# Patient Record
Sex: Female | Born: 1970 | Race: White | Hispanic: No | Marital: Single
Health system: Southern US, Community
[De-identification: ages and names within clinical notes are randomized; demographics above are authoritative.]

---

## 2005-10-28 ENCOUNTER — Emergency Department: Payer: Self-pay | Admitting: Emergency Medicine

## 2005-12-16 ENCOUNTER — Encounter: Admission: RE | Admit: 2005-12-16 | Discharge: 2006-03-16 | Payer: Self-pay | Admitting: Specialist

## 2005-12-23 ENCOUNTER — Ambulatory Visit: Payer: Self-pay | Admitting: Psychiatry

## 2005-12-23 ENCOUNTER — Other Ambulatory Visit (HOSPITAL_COMMUNITY): Admission: RE | Admit: 2005-12-23 | Discharge: 2006-03-23 | Payer: Self-pay | Admitting: Psychiatry

## 2006-02-18 ENCOUNTER — Emergency Department: Payer: Self-pay | Admitting: Emergency Medicine

## 2006-02-20 ENCOUNTER — Ambulatory Visit: Payer: Self-pay

## 2006-02-21 ENCOUNTER — Emergency Department: Payer: Self-pay | Admitting: Emergency Medicine

## 2006-03-06 ENCOUNTER — Ambulatory Visit: Payer: Self-pay

## 2007-04-07 ENCOUNTER — Emergency Department: Payer: Self-pay | Admitting: Emergency Medicine

## 2007-04-18 ENCOUNTER — Emergency Department: Payer: Self-pay | Admitting: Emergency Medicine

## 2007-05-16 ENCOUNTER — Inpatient Hospital Stay: Payer: Self-pay | Admitting: Unknown Physician Specialty

## 2007-05-16 ENCOUNTER — Other Ambulatory Visit: Payer: Self-pay

## 2007-08-19 ENCOUNTER — Emergency Department: Payer: Self-pay | Admitting: Emergency Medicine

## 2007-12-06 ENCOUNTER — Emergency Department: Payer: Self-pay | Admitting: Emergency Medicine

## 2007-12-22 ENCOUNTER — Ambulatory Visit: Payer: Self-pay

## 2007-12-23 ENCOUNTER — Ambulatory Visit: Payer: Self-pay

## 2008-12-25 ENCOUNTER — Ambulatory Visit: Payer: Self-pay | Admitting: Family Medicine

## 2009-01-09 ENCOUNTER — Ambulatory Visit: Payer: Self-pay | Admitting: Surgery

## 2009-01-12 ENCOUNTER — Ambulatory Visit: Payer: Self-pay | Admitting: Surgery

## 2009-03-07 ENCOUNTER — Emergency Department: Payer: Self-pay | Admitting: Emergency Medicine

## 2009-03-14 ENCOUNTER — Ambulatory Visit: Payer: Self-pay | Admitting: Gastroenterology

## 2009-04-02 ENCOUNTER — Ambulatory Visit: Payer: Self-pay | Admitting: Specialist

## 2009-04-05 ENCOUNTER — Ambulatory Visit: Payer: Self-pay | Admitting: Specialist

## 2009-05-01 ENCOUNTER — Ambulatory Visit: Payer: Self-pay | Admitting: Surgery

## 2009-05-17 ENCOUNTER — Emergency Department: Payer: Self-pay | Admitting: Unknown Physician Specialty

## 2009-05-20 ENCOUNTER — Emergency Department: Payer: Self-pay | Admitting: Emergency Medicine

## 2009-07-13 ENCOUNTER — Ambulatory Visit: Payer: Self-pay | Admitting: Surgery

## 2009-07-21 ENCOUNTER — Inpatient Hospital Stay: Payer: Self-pay | Admitting: Surgery

## 2009-08-07 ENCOUNTER — Ambulatory Visit: Payer: Self-pay | Admitting: Surgery

## 2009-09-20 ENCOUNTER — Emergency Department: Payer: Self-pay | Admitting: Emergency Medicine

## 2009-09-21 ENCOUNTER — Emergency Department: Payer: Self-pay | Admitting: Internal Medicine

## 2010-03-03 ENCOUNTER — Emergency Department: Payer: Self-pay | Admitting: Emergency Medicine

## 2010-04-01 ENCOUNTER — Ambulatory Visit: Payer: Self-pay | Admitting: Specialist

## 2010-04-10 ENCOUNTER — Ambulatory Visit: Payer: Self-pay | Admitting: Specialist

## 2010-08-29 ENCOUNTER — Emergency Department: Payer: Self-pay | Admitting: Emergency Medicine

## 2010-11-21 ENCOUNTER — Emergency Department: Payer: Self-pay | Admitting: Emergency Medicine

## 2010-11-22 ENCOUNTER — Emergency Department: Payer: Self-pay | Admitting: *Deleted

## 2010-11-24 ENCOUNTER — Emergency Department: Payer: Self-pay | Admitting: Emergency Medicine

## 2010-11-27 ENCOUNTER — Emergency Department: Payer: Self-pay | Admitting: Emergency Medicine

## 2010-12-19 ENCOUNTER — Emergency Department: Payer: Self-pay | Admitting: Unknown Physician Specialty

## 2010-12-25 ENCOUNTER — Emergency Department: Payer: Self-pay | Admitting: Emergency Medicine

## 2010-12-29 ENCOUNTER — Emergency Department: Payer: Self-pay | Admitting: Emergency Medicine

## 2011-01-28 ENCOUNTER — Emergency Department: Payer: Self-pay | Admitting: Emergency Medicine

## 2011-02-03 ENCOUNTER — Emergency Department: Payer: Self-pay | Admitting: Emergency Medicine

## 2011-02-06 ENCOUNTER — Ambulatory Visit: Payer: Self-pay | Admitting: Gastroenterology

## 2011-02-14 ENCOUNTER — Emergency Department: Payer: Self-pay | Admitting: Emergency Medicine

## 2011-02-28 ENCOUNTER — Ambulatory Visit: Payer: Self-pay | Admitting: Specialist

## 2011-03-27 ENCOUNTER — Emergency Department: Payer: Self-pay | Admitting: Emergency Medicine

## 2011-04-04 ENCOUNTER — Emergency Department: Payer: Self-pay | Admitting: Emergency Medicine

## 2011-04-08 ENCOUNTER — Emergency Department: Payer: Self-pay | Admitting: Emergency Medicine

## 2011-04-11 ENCOUNTER — Encounter: Payer: Self-pay | Admitting: Cardiothoracic Surgery

## 2011-04-11 ENCOUNTER — Encounter: Payer: Self-pay | Admitting: Nurse Practitioner

## 2011-04-25 ENCOUNTER — Emergency Department: Payer: Self-pay

## 2011-05-08 ENCOUNTER — Emergency Department: Payer: Self-pay | Admitting: Emergency Medicine

## 2011-05-12 ENCOUNTER — Emergency Department: Payer: Self-pay | Admitting: *Deleted

## 2011-05-15 ENCOUNTER — Inpatient Hospital Stay: Payer: Self-pay | Admitting: Psychiatry

## 2011-05-20 ENCOUNTER — Ambulatory Visit: Payer: Self-pay | Admitting: Unknown Physician Specialty

## 2011-05-31 ENCOUNTER — Ambulatory Visit: Payer: Self-pay | Admitting: Unknown Physician Specialty

## 2011-06-04 ENCOUNTER — Emergency Department: Payer: Self-pay

## 2011-06-05 ENCOUNTER — Emergency Department: Payer: Self-pay | Admitting: *Deleted

## 2011-06-08 ENCOUNTER — Emergency Department: Payer: Self-pay | Admitting: Internal Medicine

## 2011-07-08 ENCOUNTER — Ambulatory Visit: Payer: Self-pay | Admitting: Family Medicine

## 2011-07-10 ENCOUNTER — Ambulatory Visit: Payer: Self-pay | Admitting: Family Medicine

## 2011-07-10 ENCOUNTER — Ambulatory Visit: Payer: Self-pay | Admitting: Specialist

## 2011-07-10 DIAGNOSIS — Z0289 Encounter for other administrative examinations: Secondary | ICD-10-CM

## 2011-07-19 ENCOUNTER — Emergency Department: Payer: Self-pay | Admitting: Emergency Medicine

## 2011-08-05 ENCOUNTER — Ambulatory Visit: Payer: Self-pay | Admitting: Specialist

## 2011-08-11 ENCOUNTER — Emergency Department: Payer: Self-pay | Admitting: Emergency Medicine

## 2011-09-03 ENCOUNTER — Other Ambulatory Visit: Payer: Self-pay | Admitting: Pain Medicine

## 2011-09-03 ENCOUNTER — Ambulatory Visit: Payer: Self-pay | Admitting: Pain Medicine

## 2011-09-03 LAB — MAGNESIUM: Magnesium: 1.9 mg/dL

## 2011-09-03 LAB — COMPREHENSIVE METABOLIC PANEL
Albumin: 3.6 g/dL (ref 3.4–5.0)
Anion Gap: 12 (ref 7–16)
Bilirubin,Total: 0.2 mg/dL (ref 0.2–1.0)
Calcium, Total: 8.8 mg/dL (ref 8.5–10.1)
Co2: 21 mmol/L (ref 21–32)
Creatinine: 0.72 mg/dL (ref 0.60–1.30)
EGFR (Non-African Amer.): >60
Osmolality: 277 (ref 275–301)
SGOT(AST): 16 U/L (ref 15–37)
Total Protein: 8.1 g/dL (ref 6.4–8.2)

## 2011-09-03 LAB — SEDIMENTATION RATE: Erythrocyte Sed Rate: 22 mm/hr — ABNORMAL HIGH (ref 0–20)

## 2011-09-03 LAB — FOLATE: Folic Acid: 3.9 ng/mL (ref 3.1–100.0)

## 2011-09-05 ENCOUNTER — Ambulatory Visit: Payer: Self-pay | Admitting: Pain Medicine

## 2011-09-15 ENCOUNTER — Emergency Department: Payer: Self-pay | Admitting: Emergency Medicine

## 2011-09-15 LAB — URINALYSIS, COMPLETE
Bilirubin,UR: NEGATIVE
Glucose,UR: NEGATIVE mg/dL (ref 0–75)
Nitrite: NEGATIVE
RBC,UR: 9 /HPF (ref 0–5)
Squamous Epithelial: 41
WBC UR: 18 /HPF (ref 0–5)

## 2011-09-15 LAB — CBC
HGB: 10.9 g/dL — ABNORMAL LOW (ref 12.0–16.0)
RBC: 4.45 10*6/uL (ref 3.80–5.20)
RDW: 17.8 % — ABNORMAL HIGH (ref 11.5–14.5)

## 2011-09-15 LAB — COMPREHENSIVE METABOLIC PANEL
Albumin: 3.3 g/dL — ABNORMAL LOW (ref 3.4–5.0)
Alkaline Phosphatase: 88 U/L (ref 50–136)
Anion Gap: 13 (ref 7–16)
BUN: 6 mg/dL — ABNORMAL LOW (ref 7–18)
Bilirubin,Total: 0.2 mg/dL (ref 0.2–1.0)
Calcium, Total: 8.5 mg/dL (ref 8.5–10.1)
Creatinine: 0.8 mg/dL (ref 0.60–1.30)
Osmolality: 281 (ref 275–301)
Potassium: 3.7 mmol/L (ref 3.5–5.1)
Sodium: 142 mmol/L (ref 136–145)
Total Protein: 7.5 g/dL (ref 6.4–8.2)

## 2011-09-15 LAB — PREGNANCY, URINE: Pregnancy Test, Urine: NEGATIVE m[IU]/mL

## 2011-09-15 LAB — WET PREP, GENITAL

## 2011-09-16 ENCOUNTER — Encounter: Payer: Self-pay | Admitting: Family Medicine

## 2011-09-17 ENCOUNTER — Emergency Department: Payer: Self-pay | Admitting: Emergency Medicine

## 2011-10-08 ENCOUNTER — Ambulatory Visit: Payer: Self-pay | Admitting: Pain Medicine

## 2011-10-13 IMAGING — US ABDOMEN ULTRASOUND
1 series · 17 of 25 positions shown · non-contrast
Comparison: none

REASON FOR EXAM: RUQ pain
COMMENTS:

[Series 1: abdomen ultrasound · 17 of 47 slices shown]
[im 1/47]
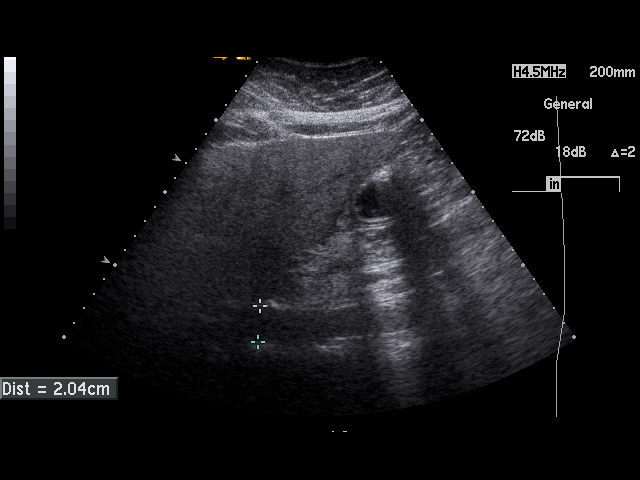
[im 4/47]
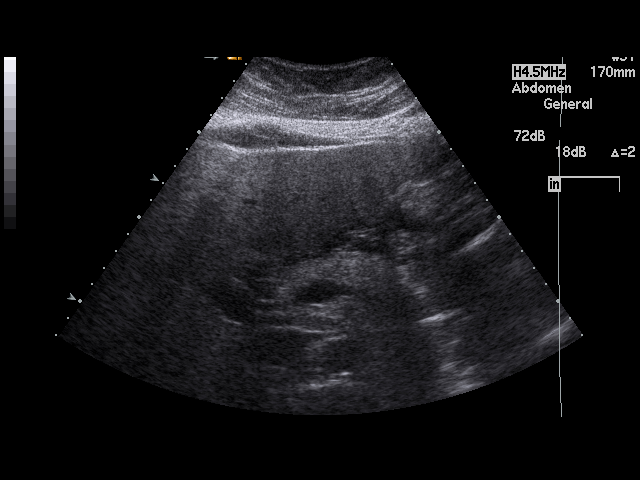
[im 6/47]
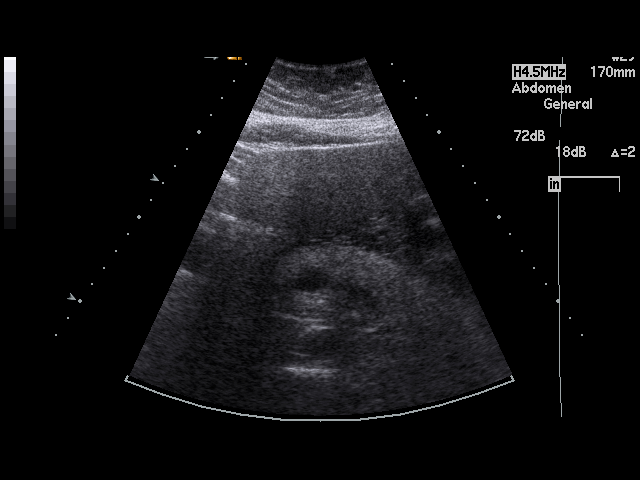
[im 10/47]
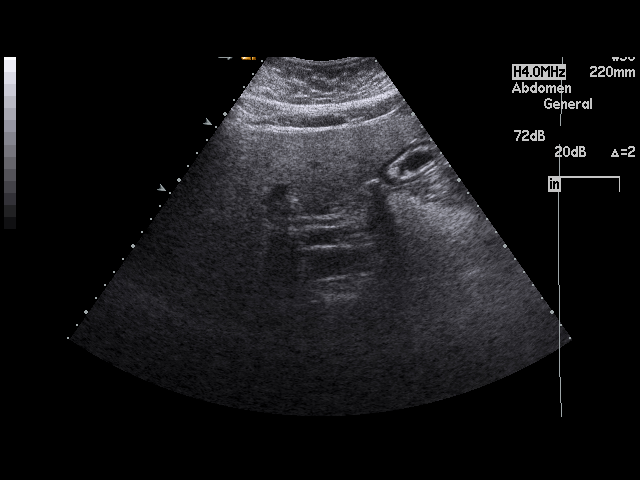
[im 12/47]
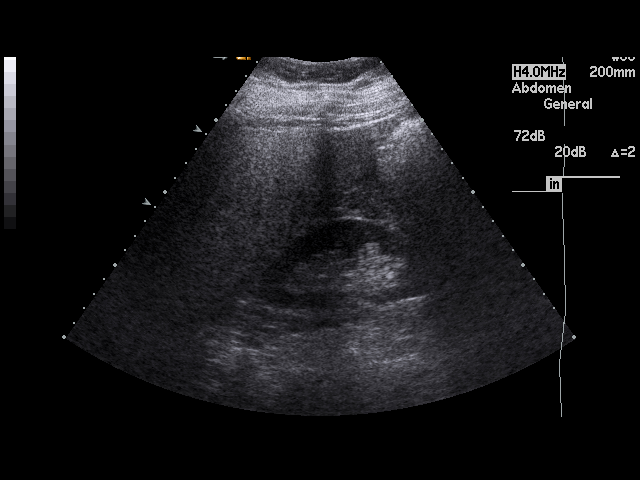
[im 16/47]
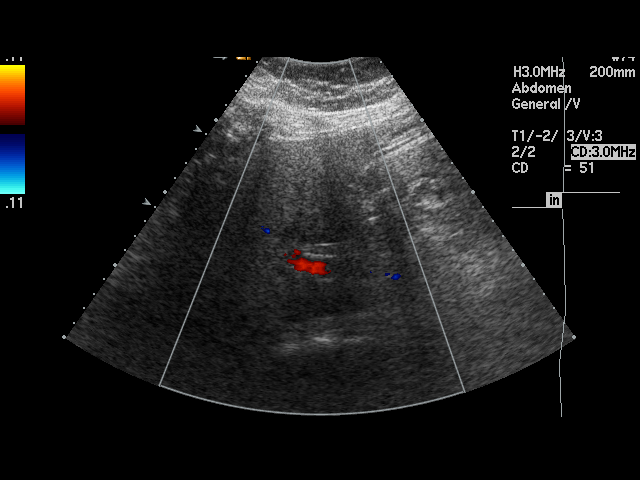
[im 18/47]
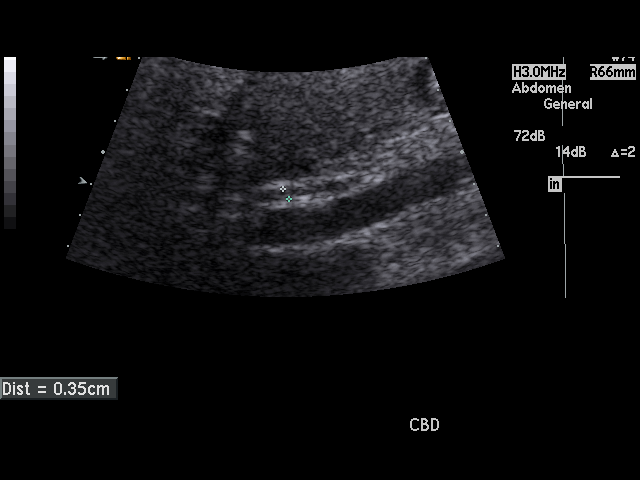
[im 22/47]
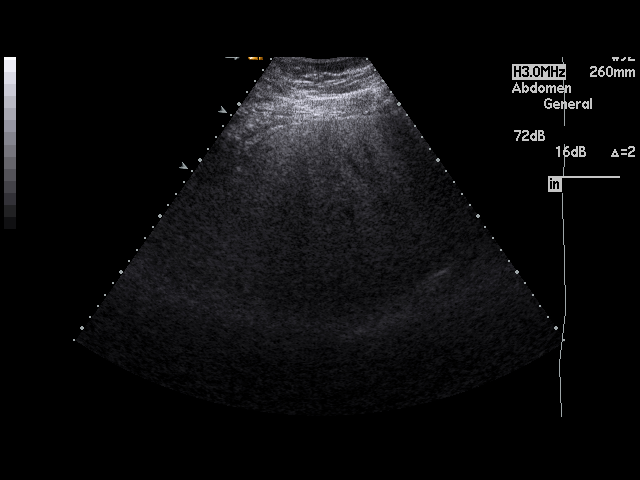
[im 24/47]
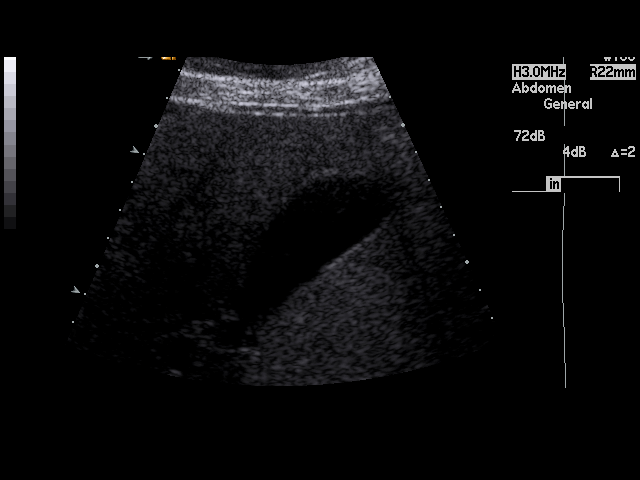
[im 25/47]
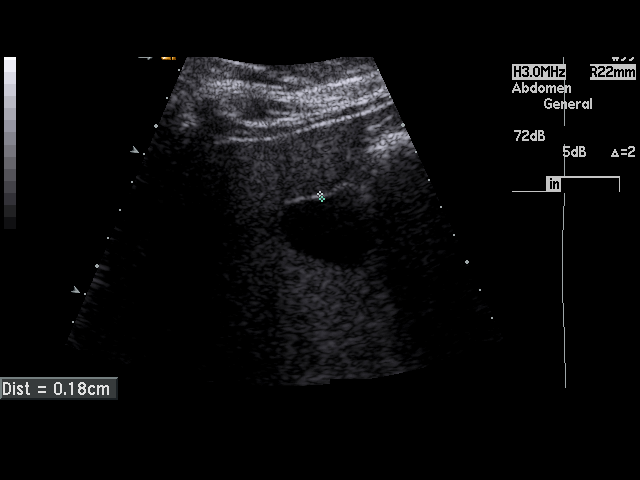
[im 29/47]
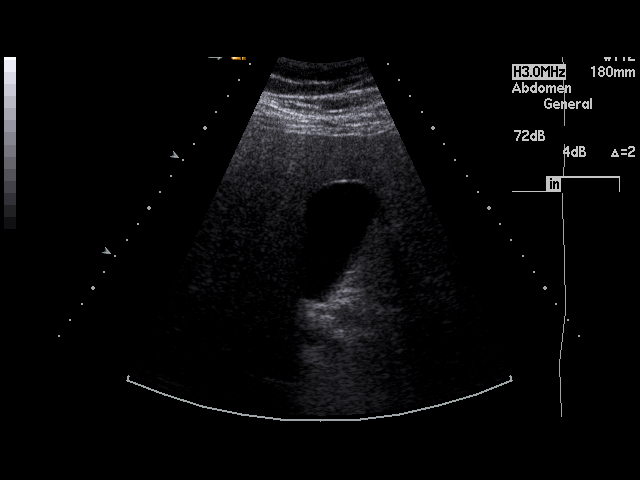
[im 31/47]
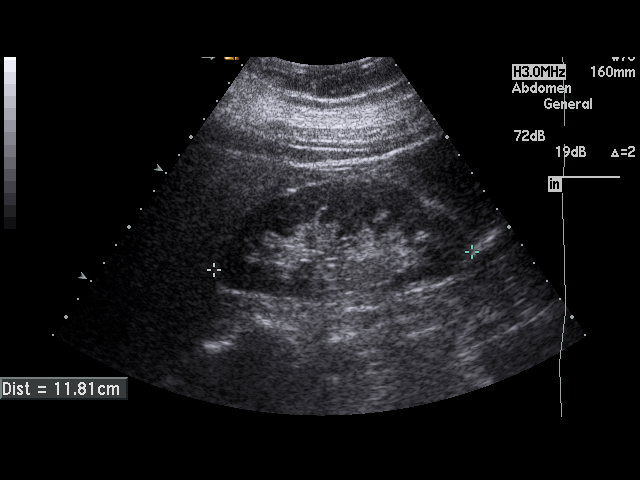
[im 35/47]
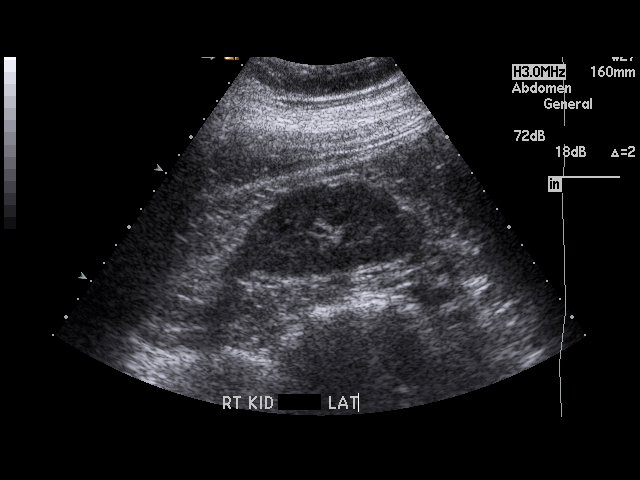
[im 37/47]
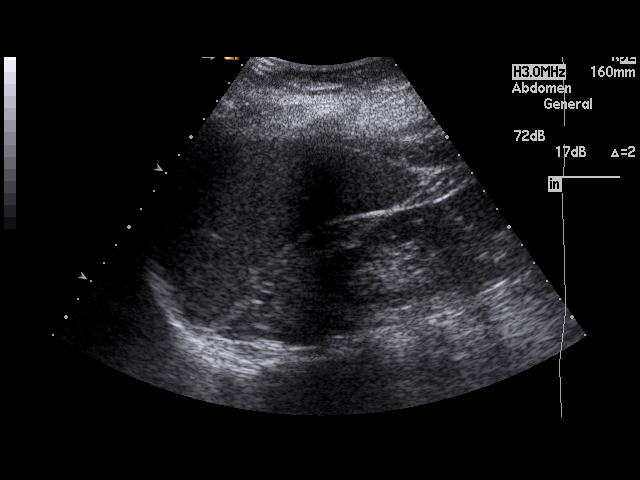
[im 41/47]
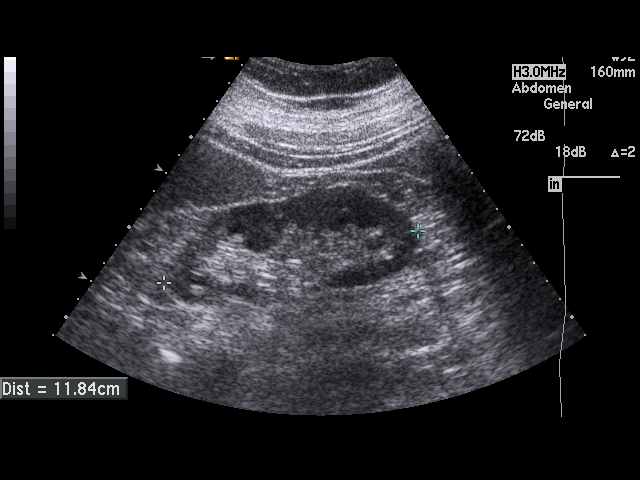
[im 43/47]
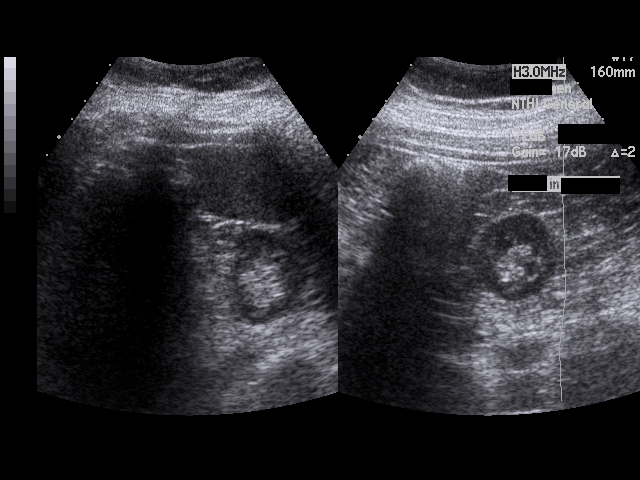
[im 47/47]
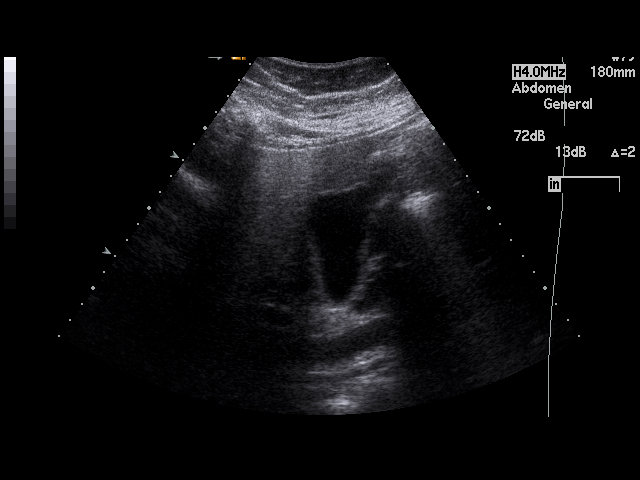

[17 of 25 positions shown; findings below may reference images not displayed]

PROCEDURE:     US  - US ABDOMEN GENERAL SURVEY  - August 07, 2009  [DATE]

RESULT:     The liver appears hyperechogenic, suspicious for fatty
infiltration. No focal tight mass lesions are seen. A portion of the
pancreatic tail is obscured but otherwise the pancreas is normal in
appearance. The abdominal aorta and inferior vena cava show no significant
abnormalities. Spleen size is normal. No gallstones are seen. There is no
thickening of the gallbladder wall. The common bile duct measures 3.5 mm in
diameter which is within normal limits. The kidneys show no hydronephrosis.
There is no ascites.
IMPRESSION: 1. Probable fatty infiltration of the liver.
2. No gallstones or other acute change is identified

## 2011-10-16 ENCOUNTER — Ambulatory Visit: Payer: Self-pay | Admitting: Pain Medicine

## 2011-11-01 ENCOUNTER — Emergency Department: Payer: Self-pay | Admitting: Unknown Physician Specialty

## 2011-11-26 ENCOUNTER — Ambulatory Visit: Payer: Self-pay | Admitting: Obstetrics and Gynecology

## 2011-11-26 LAB — URINALYSIS, COMPLETE
Bilirubin,UR: NEGATIVE
Blood: NEGATIVE
Glucose,UR: NEGATIVE mg/dL (ref 0–75)
Protein: 30
RBC,UR: 3 /HPF (ref 0–5)
Specific Gravity: 1.028 (ref 1.003–1.030)
Squamous Epithelial: 8

## 2011-11-26 LAB — PREGNANCY, URINE: Pregnancy Test, Urine: NEGATIVE m[IU]/mL

## 2011-11-26 LAB — WBC: WBC: 10 10*3/uL (ref 3.6–11.0)

## 2011-12-11 ENCOUNTER — Ambulatory Visit: Payer: Self-pay | Admitting: Obstetrics and Gynecology

## 2011-12-11 LAB — PREGNANCY, URINE: Pregnancy Test, Urine: NEGATIVE m[IU]/mL

## 2011-12-12 LAB — PATHOLOGY REPORT

## 2011-12-12 LAB — HEMATOCRIT: HCT: 30.9 % — ABNORMAL LOW (ref 35.0–47.0)

## 2012-03-24 ENCOUNTER — Emergency Department: Payer: Self-pay | Admitting: Emergency Medicine

## 2012-03-24 LAB — URINALYSIS, COMPLETE
Bilirubin,UR: NEGATIVE
Blood: NEGATIVE
Glucose,UR: NEGATIVE mg/dL (ref 0–75)
Leukocyte Esterase: NEGATIVE
Protein: NEGATIVE
Specific Gravity: 1.015 (ref 1.003–1.030)
Squamous Epithelial: 3

## 2012-03-25 LAB — CBC
HCT: 42.5 % (ref 35.0–47.0)
HGB: 13.8 g/dL (ref 12.0–16.0)
MCH: 26.8 pg (ref 26.0–34.0)
MCHC: 32.5 g/dL (ref 32.0–36.0)
WBC: 13.9 10*3/uL — ABNORMAL HIGH (ref 3.6–11.0)

## 2012-03-25 LAB — COMPREHENSIVE METABOLIC PANEL
Albumin: 3.5 g/dL (ref 3.4–5.0)
Alkaline Phosphatase: 103 U/L (ref 50–136)
Anion Gap: 12 (ref 7–16)
BUN: 8 mg/dL (ref 7–18)
Bilirubin,Total: 0.4 mg/dL (ref 0.2–1.0)
Chloride: 109 mmol/L — ABNORMAL HIGH (ref 98–107)
Creatinine: 0.64 mg/dL (ref 0.60–1.30)
Glucose: 81 mg/dL (ref 65–99)
SGOT(AST): 12 U/L — ABNORMAL LOW (ref 15–37)
SGPT (ALT): 15 U/L (ref 12–78)
Total Protein: 7.5 g/dL (ref 6.4–8.2)

## 2012-04-14 ENCOUNTER — Emergency Department: Payer: Self-pay | Admitting: Emergency Medicine

## 2012-04-16 ENCOUNTER — Emergency Department: Payer: Self-pay | Admitting: Emergency Medicine

## 2012-04-23 ENCOUNTER — Encounter: Payer: Self-pay | Admitting: Nurse Practitioner

## 2012-04-23 ENCOUNTER — Encounter: Payer: Self-pay | Admitting: Cardiothoracic Surgery

## 2012-06-27 ENCOUNTER — Inpatient Hospital Stay: Payer: Self-pay | Admitting: Psychiatry

## 2012-06-27 LAB — COMPREHENSIVE METABOLIC PANEL
Alkaline Phosphatase: 99 U/L (ref 50–136)
Anion Gap: 7 (ref 7–16)
Calcium, Total: 8.6 mg/dL (ref 8.5–10.1)
Co2: 23 mmol/L (ref 21–32)
Creatinine: 0.73 mg/dL (ref 0.60–1.30)
EGFR (African American): >60
EGFR (Non-African Amer.): >60
Potassium: 3.9 mmol/L (ref 3.5–5.1)
SGOT(AST): 8 U/L — ABNORMAL LOW (ref 15–37)
SGPT (ALT): 12 U/L (ref 12–78)
Total Protein: 7.9 g/dL (ref 6.4–8.2)

## 2012-06-27 LAB — CBC
HGB: 14.4 g/dL (ref 12.0–16.0)
MCHC: 34.3 g/dL (ref 32.0–36.0)
Platelet: 296 10*3/uL (ref 150–440)
RDW: 17.9 % — ABNORMAL HIGH (ref 11.5–14.5)
WBC: 10.7 10*3/uL (ref 3.6–11.0)

## 2012-06-27 LAB — ETHANOL: Ethanol: <3 mg/dL

## 2012-06-28 LAB — URINALYSIS, COMPLETE
Bilirubin,UR: NEGATIVE
Blood: NEGATIVE
Glucose,UR: NEGATIVE mg/dL (ref 0–75)
Ketone: NEGATIVE
Nitrite: NEGATIVE
Ph: 5 (ref 4.5–8.0)
Protein: 30
RBC,UR: 6 /HPF (ref 0–5)
Specific Gravity: 1.023 (ref 1.003–1.030)
Squamous Epithelial: 9

## 2012-06-28 LAB — DRUG SCREEN, URINE
Amphetamines, Ur Screen: NEGATIVE (ref ?–1000)
Barbiturates, Ur Screen: NEGATIVE (ref ?–200)
Benzodiazepine, Ur Scrn: POSITIVE (ref ?–200)
MDMA (Ecstasy)Ur Screen: NEGATIVE (ref ?–500)
Methadone, Ur Screen: NEGATIVE (ref ?–300)
Phencyclidine (PCP) Ur S: NEGATIVE (ref ?–25)

## 2012-08-07 ENCOUNTER — Emergency Department: Payer: Self-pay | Admitting: Emergency Medicine

## 2012-09-28 DEATH — deceased

## 2013-11-20 IMAGING — CT CT STONE STUDY
1 of 2 series · 15 of 32 positions shown, 19 images · non-contrast
Comparison: None

REASON FOR EXAM: L flank pain
COMMENTS:

PROCEDURE:     CT  - CT ABDOMEN /PELVIS WO (STONE)  - September 15, 2011  [DATE]
RESULT:     Indication: Left flank pain
TECHNIQUE: Multiple axial images from the lung bases to the symphysis pubis
were obtained without oral and without intravenous contrast.

[Series 2: stone · axial · 0.74mm/px · z∈[-608,-167]mm · 15 of 167 slices shown, 19 images]
[im 13/167  soft-tissue]
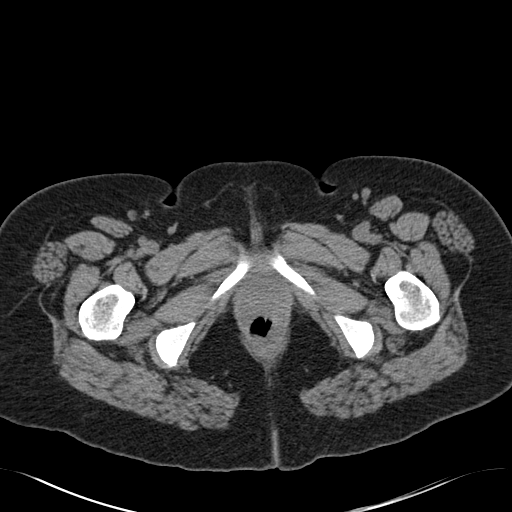
[im 13/167  bone]
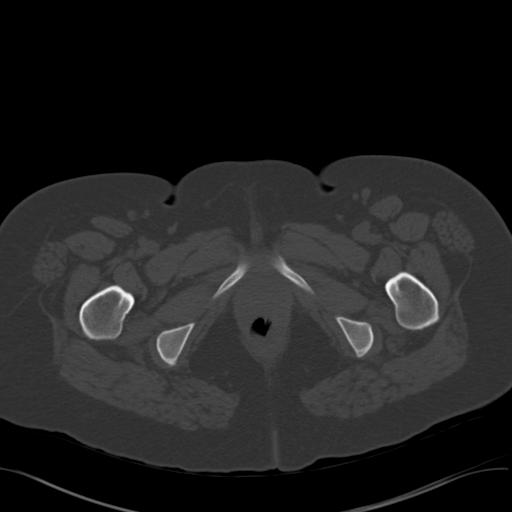
[im 25/167  soft-tissue]
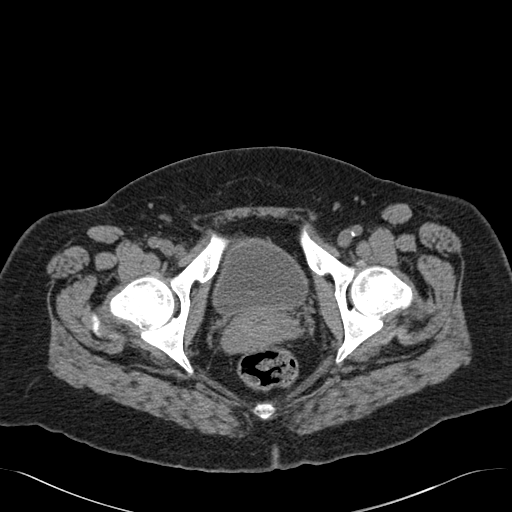
[im 37/167  soft-tissue]
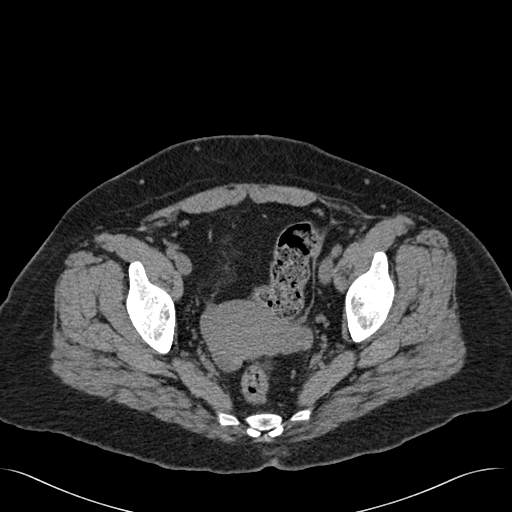
[im 50/167  soft-tissue]
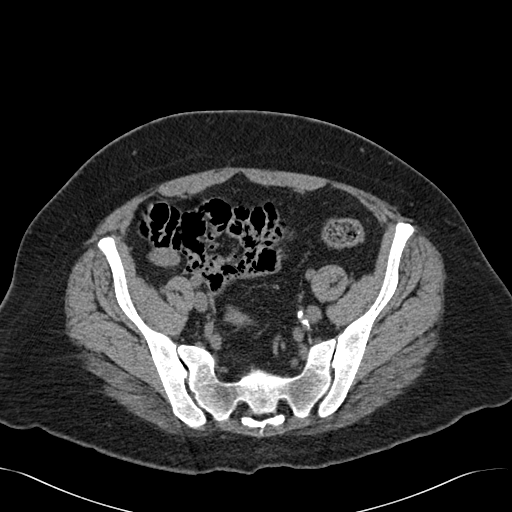
[im 62/167  soft-tissue]
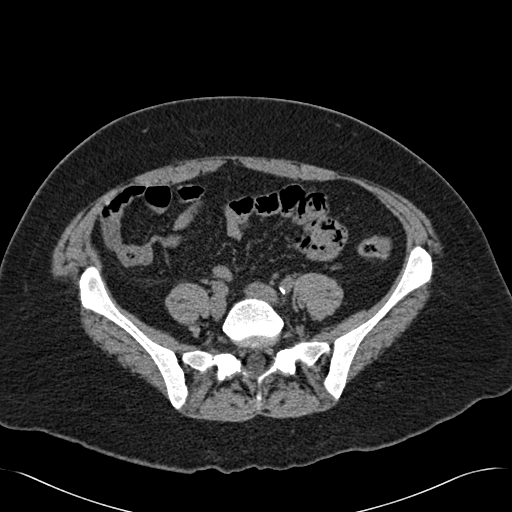
[im 74/167  soft-tissue]
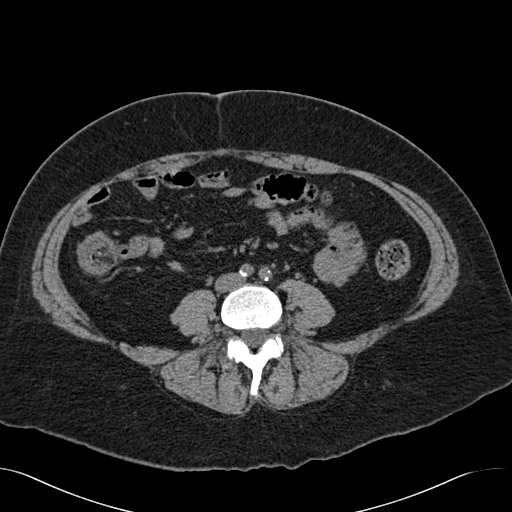
[im 87/167  soft-tissue]
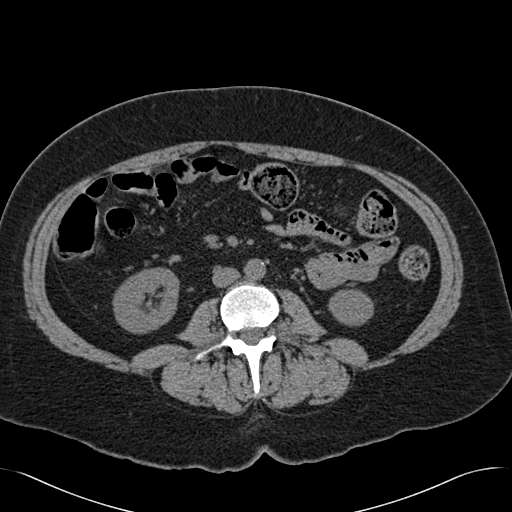
[im 99/167  soft-tissue]
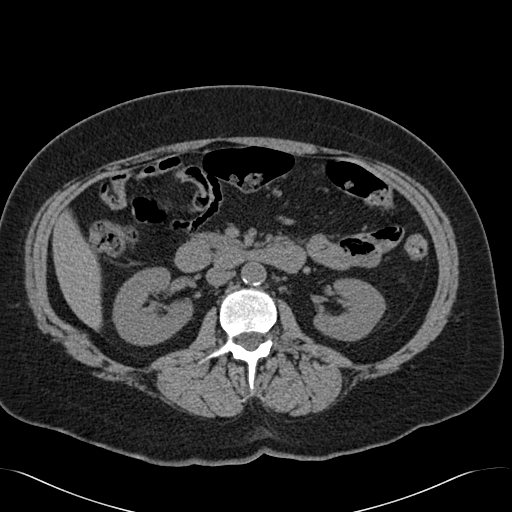
[im 111/167  soft-tissue]
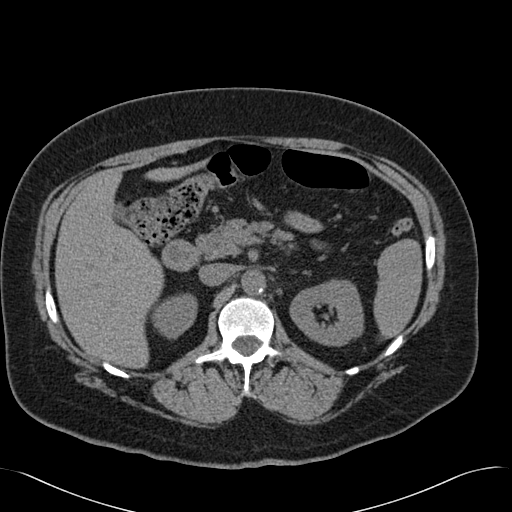
[im 111/167  bone]
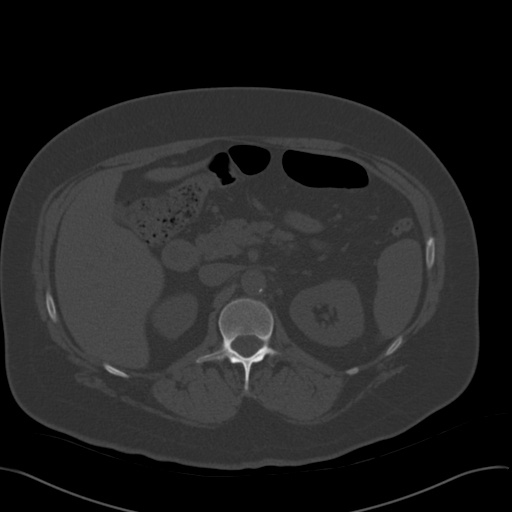
[im 123/167  soft-tissue]
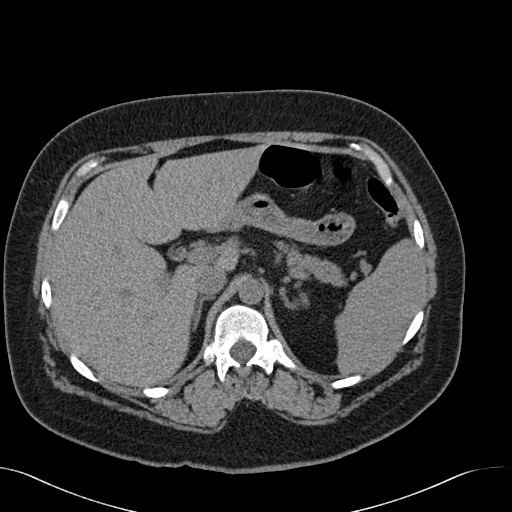
[im 136/167  soft-tissue]
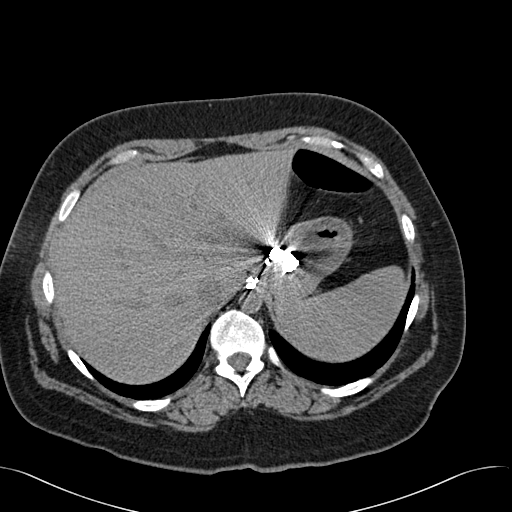
[im 142/167  lung]
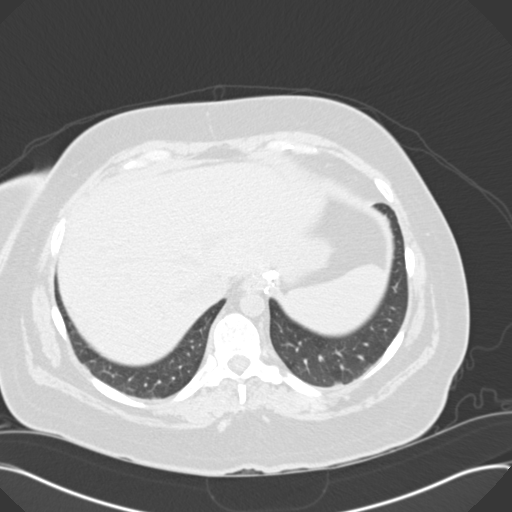
[im 148/167  soft-tissue]
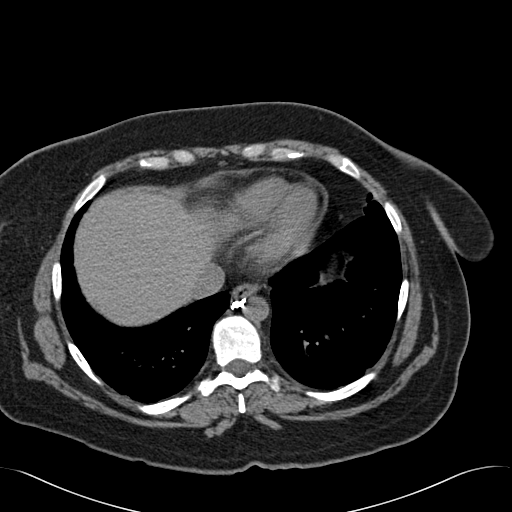
[im 148/167  lung]
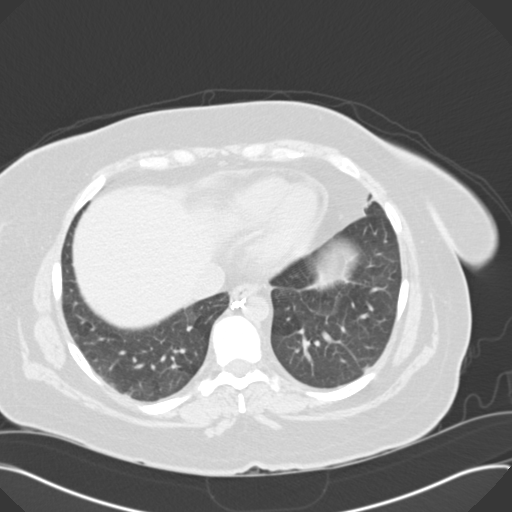
[im 154/167  lung]
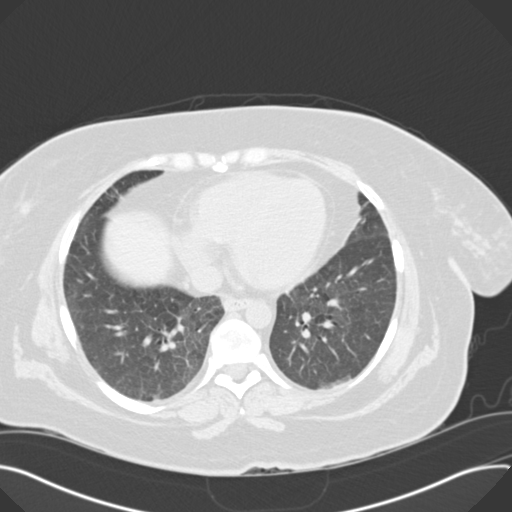
[im 160/167  soft-tissue]
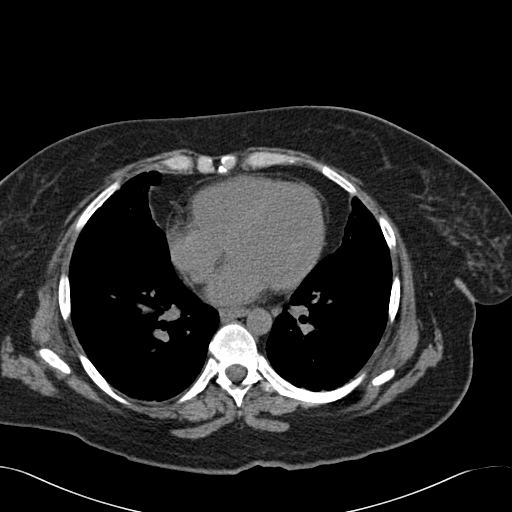
[im 160/167  lung]
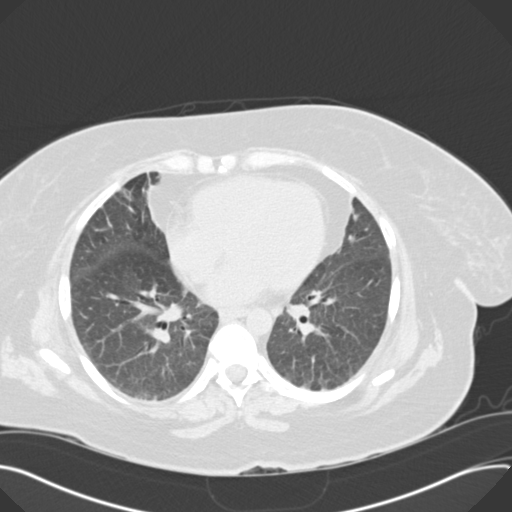

[15 of 32 positions shown; findings below may reference images not displayed]

FINDINGS: The lung bases are clear. There is no pleural or pericardial effusions.

No renal, ureteral, or bladder calculi. No obstructive uropathy. No
perinephric stranding is seen. The kidneys are symmetric in size without
evidence for exophytic mass. The bladder is unremarkable.

The liver demonstrates no focal abnormality. The gallbladder is
unremarkable. The spleen demonstrates no focal abnormality. The adrenal
glands and pancreas are normal.

The unopacified stomach, duodenum, small intestine, and large intestine are
unremarkable, but evaluation is limited by lack of oral contrast.  There is
no pneumoperitoneum, pneumatosis, or portal venous gas. There is no
abdominal or pelvic free fluid. There is no lymphadenopathy.

The abdominal aorta is normal in caliber with atherosclerosis.

The osseous structures are unremarkable.
IMPRESSION: 1. No urolithiasis or obstructive uropathy.

## 2014-10-20 NOTE — Discharge Summary (Signed)
PATIENT NAME:  Janet Potter, Janet Potter MR#:  [id number] DATE OF BIRTH:  [birth year]  DATE OF ADMISSION:  06/27/2012 DATE OF DISCHARGE:  07/03/2012  HOSPITAL COURSE:  See dictated history and physical for details of admission.  This 44 year old woman with a history of depression, mood swings and substance abuse came into the hospital stating that she was depressed and suicidal.  Very much requesting treatment.  In the hospital she mostly stayed withdrawn and was not very participatory.  She did take medication and reported that she felt like her mood was slightly better.  She did not show any suicidal or dangerous behavior in the hospital.  I discussed her history with her including her multiple failed trials of other antidepressants and suggested that we try a tricyclic antidepressant given that she had a history recently of poor sleep and poor appetite.  She agreed to this and we started nortriptyline with the dose increased to 50 mg at night by the time of discharge.  Unfortunately, the patient became disgruntled and irritable and requested discharge prematurely despite still having depressive symptoms.  She was not endorsing suicidality and was not acutely committable.  When her 72 hours ran out she continued to request discharge and was released, but with a recommendation that she absolutely stay on medication and avoid drugs and will continue to follow up in the community with outpatient treatment through community mental health.   DISCHARGE MEDICATIONS:  Prozac 40 mg b.i.d, Flonase nasal spray one spray both nostrils twice a day, Advair Diskus 250/50 1 puff b.i.d., Prilosec 40 mg p.o. q.a.m., Lyrica 100 mg t.i.d., Requip 3 mg p.o. at bedtime, Ambien 10 mg p.o. at bedtime p.r.Potter. for sleep, nortriptyline 50 mg p.o. at bedtime, Septra DS one p.o. b.i.d. for five more days.  LABORATORY RESULTS:  Admission labs showed a CBC that was normal.  Chemistry panel showed only a slightly low sodium at 135.  Alcohol  undetectable.  TSH normal at 1.39.  Urinalysis showed positive screen for a urinary tract infection.  Drug screen positive for benzodiazepines.    MENTAL STATUS EXAMINATION AT DISCHARGE:  Casually dressed, neatly groomed woman, looks her stated age.  Passively cooperative.  Eye contact intermittent.  Psychomotor activity still sluggish.  Speech slow, but understandable.  Affect mildly dysphoric.  Mood stated as okay.  Thoughts were lucid.  No evidence of delusional thinking.  Denied auditory or visual hallucinations.  Denied suicidal or homicidal ideation.  Showed improved judgment and insight.  Short and long-term memory grossly intact.  Insight and judgment slightly improved.   DISPOSITION:  Discharged to the community.   [facility]   DIAGNOSIS PRINCIPLE AND PRIMARY:  AXIS I:  Depression, not otherwise specified.   SECONDARY DIAGNOSES: AXIS I:  History of polysubstance dependence, in early remission.  AXIS II:  Borderline features.  AXIS III:  Urinary tract infection, chronic pain, gastric reflux symptoms, chronic obstructive pulmonary disease, seasonal rhinitis, restless legs.  AXIS IV:  Severe from chronic loneliness.  AXIS V:  Functioning at time of discharge 55.       ____________________________ Audery AmelJohn T. Chales Pelissier, MD jtc:ea D: 07/06/2012 22:34:00 ET T: 07/07/2012 02:39:58 ET JOB#: [id number]  cc: Audery AmelJohn T. Tziporah Knoke, MD, <Dictator> Audery AmelJOHN T Maidie Streight MD ELECTRONICALLY SIGNED 07/07/2012 11:48

## 2014-10-20 NOTE — H&P (Signed)
PATIENT NAME:  Janet Potter, Janet N MR#:  Potter DATE OF BIRTH:  1970-10-24  DATE OF ADMISSION:  06/27/2012  DATE OF EVALUATION:  06/28/2012   IDENTIFYING INFORMATION AND CHIEF COMPLAINT: A 44 year old woman with a history of depression and anxiety as well as substance abuse problems presented to the Emergency Room stating that she was having suicidal ideation and feeling hopeless and depressed.   CHIEF COMPLAINT:  "I just don't feel right."   HISTORY OF PRESENT ILLNESS:  Information obtained from the patient and from the chart. She tells me that for the past several weeks to months she has been feeling depressed. Mood is consistently sad and down. She is not sleeping well at night. She has no appetite and has lost weight. She has lost interest in all of her normal activities. She says that she has been having thoughts about killing herself and feeling hopeless and useless. Feels like her family hates her. She has been having very little activity. She is not currently following up with a psychiatrist. She is getting medication from Dr. Juel BurrowMasoud, her primary care doctor. She does report that she has had some plans to cut her wrists, but has not acted on it. She denies to me that she has been abusing drugs.   PAST PSYCHIATRIC HISTORY:  History of prior hospitalizations with a history of self-injuring behavior and also a history of substance abuse particularly with opiates. She has required opiate detox in the past. I used to see her in my outpatient clinic for treatment of anxiety and depression, but she had a difficult time with compliance and has not followed up there in a long period of time. She never really seemed to have good response to medication. Did not have a clear history of psychotic symptoms in the past.   PAST MEDICAL HISTORY:  The patient has a history of chronic pain, probably fibromyalgia, and has been abusing opiates in the past. Does not have any other significant clear ongoing medical  problems.   SOCIAL HISTORY:  Lives alone. Family seemed to no longer have much to do with her. Has no children. Not currently involved in any relationship. Very limited social activity.   SUBSTANCE ABUSE HISTORY:  History of abuse of narcotics.   CURRENT MEDICATIONS:  Medication list on admission was Prozac 40 mg twice a day, Xanax 1 mg 3 times a day, hydrocodone as needed, omeprazole 40 mg once a day, Lyrica 100 mg 3 times a day, ibuprofen 800 mg once a day as needed, but the patient indicates that she is probably not been very compliant with these.   ALLERGIES:  NO KNOWN DRUG ALLERGIES.   REVIEW OF SYSTEMS:  Complains of severely depressed mood. Fatigue. Difficulty sleeping. Suicidal ideation. Denies auditory or visual hallucinations. Denies homicidal ideation. Denies acute panic attacks. No new physical symptoms. Chronic generalized pain. No appetite.   MENTAL STATUS EXAMINATION:  Poorly dressed and groomed woman who looks her stated age. Passive in the interview. Poor eye contact. Slow psychomotor activity. Speech quiet and slow. Affect blunted and dysphoric. Mood stated as depressed. Thoughts are slow, but lucid and concrete. Denies auditory or visual hallucinations. Denies any homicidal ideation. Says she does have suicidal ideation and wishes that she would die. Judgment and insight appear to be impaired. Baseline intelligence low average.   PHYSICAL EXAMINATION: GENERAL: The patient does not appear to be in any acute physical distress.  HEENT: Pupils are equal and reactive. Face symmetric.  SKIN: No acute skin lesions.  BACK AND NECK: Nontender.  MUSCULOSKELETAL: Full range of motion at all extremities. Normal gait. Strength and reflexes normal and symmetric throughout.  NEUROLOGICAL: Cranial nerves symmetric.  LUNGS: Clear with no wheezes.  HEART: Regular rate and rhythm.  ABDOMEN: Soft, nontender, normal bowel sounds.  VITAL SIGNS: Blood pressure on admission 118/77, respirations  18, pulse 79, temperature 98.   LABORATORY DATA:  Drug screen on admission positive for benzodiazepines, but not for opiates. Urinalysis appears to show a urinary tract infection. TSH is normal at 1.39. Alcohol not detected. Chemistry panel shows low sodium at 135. CBC is unremarkable.   ASSESSMENT:  A 44 year old woman with a history of depression, anxiety and substance abuse presents to the hospital with months of progressively depressed mood, multiple symptoms of major depression. Does not appear to have been abusing substances recently. She does have suicidal ideation. Requires hospitalization because of dangerousness to self.   TREATMENT PLAN:  Admit to psychiatry. I am not going to continue the Xanax, but will continue the Prozac. Monitor behavior and mood. Daily individual and group psychotherapy. I have suggested to the patient that we start her on nortriptyline as she has never been on a tricyclic antidepressant in the past. She is agreeable to the plan.   DIAGNOSIS, PRINCIPAL AND PRIMARY:  AXIS I: Major depression, severe, recurrent.   SECONDARY DIAGNOSES: AXIS I: Opiate dependence in early remission.  AXIS II: Borderline features.  AXIS III: Fibromyalgia.  AXIS IV: Severe from lack of primary social support.  AXIS V: Functioning at time of evaluation 30.    ____________________________ Audery Amel, MD jtc:si D: 06/29/2012 19:42:00 ET T: 06/29/2012 20:53:04 ET JOB#: [id number]  cc: Audery Amel, MD, <Dictator> Audery Amel MD ELECTRONICALLY SIGNED 07/01/2012 11:03

## 2014-10-22 NOTE — Op Note (Signed)
PATIENT NAME:  Janet Potter, [name] MR#:  [id number] DATE OF BIRTH:  Jan 04, 1971  DATE OF PROCEDURE:  12/11/2011  PREOPERATIVE DIAGNOSIS: Menorrhagia.   POSTOPERATIVE DIAGNOSIS: Menorrhagia.   PROCEDURE PERFORMED: Laparoscopic supracervical hysterectomy and cystoscopy.   SURGEON: Senaida LangeLashawn Weaver Lee, M.D.   ASSISTANT: Vena AustriaAndreas Staebler, M.D.   ANESTHESIA: General.   ESTIMATED BLOOD LOSS: 50 mL.   COMPLICATIONS: None.   FINDINGS: Small but normal-appearing uterus, adhesions of bilateral ovaries and tubes to the posterior uterus and to the posterior cul-de-sac.   SPECIMEN: Uterus without cervix.   INDICATIONS: This is a 44 year old who presents with menorrhagia resulting in anemia. The patient desired definitive surgical management. The risks, benefits, and alternatives of the procedure were explained and informed consent was obtained.   DESCRIPTION OF PROCEDURE: The patient was taken the Operating Room with IV fluids running. She was prepped and draped in the usual sterile fashion in [place] stirrups. A speculum was placed inside the vagina. The anterior lip of the cervix was grasped with a single-tooth tenaculum and a Hulka tenaculum was placed for uterine manipulation. Attention was turned to the patient's abdomen where the infraumbilical region was injected with 0.5% Sensorcaine and an 11 mm incision was made. The Veress needle was placed and the abdomen was insufflated with CO2 gas. The Veress needle was removed and the abdomen was entered under direct visualization using a #11 Xcel trocar. Two additional 11 mm trocars were placed in the patient's abdomen laterally and findings were as previously stated. The left uterine cornu was grasped. A Falope ring was seen on the patients <<MISSING TEXT>> (dictation stopped) ____________________________ Sonda PrimesLashawn A. Patton SallesWeaver-Lee, MD law:slb D: 12/11/2011 12:18:11 ET T: 12/11/2011 12:54:08 ET JOB#: [id number]  cc: Flint MelterLashawn A. Patton SallesWeaver-Lee, MD, <Dictator>

## 2014-10-22 NOTE — Op Note (Signed)
PATIENT NAME:  Janet Potter, Janet Potter MR#:  [id number] DATE OF BIRTH:  [birth year]  DATE OF PROCEDURE:  12/11/2011  PREOPERATIVE DIAGNOSIS: Menorrhagia resulting in anemia.   POSTOPERATIVE DIAGNOSIS: Menorrhagia resulting in anemia.   PROCEDURE: LSH and cystoscopy.   SURGEON: Senaida LangeLashawn Weaver Lee, M.D.   ASSISTANT: Vena AustriaAndreas Staebler, MD  ANESTHESIA: General.   ESTIMATED BLOOD LOSS: 50 mL.   COMPLICATIONS: None.   FINDINGS: Small but normal-appearing uterus, adhesions of bilateral ovaries and tubes to the posterior uterus and posterior cul-de-sac.   SPECIMEN: Uterus without cervix.   INDICATIONS: The patient is a 44 year old who presents with menorrhagia which was causing anemia. The patient desired definitive surgical management. The risks, benefits, and indications of the procedure were explained to the patient and informed consent was obtained.   DESCRIPTION OF PROCEDURE: The patient was taken to the Operating Room with IV fluids running. She was prepped and draped in the usual sterile fashion in [place] stirrups. A speculum was placed inside the vagina. The anterior lip of the cervix was grasped with a single-tooth tenaculum and a Hulka tenaculum was placed for uterine manipulation. Attention was turned to her abdomen where the infraumbilical region was injected with 0.5% Sensorcaine. An 11 mm incision was made, the Veress needle was placed, and the abdomen was insufflated with CO2 gas. The Veress needle was removed and the abdomen was entered under direct visualization using an 11 mm Xcel trocar. Two additional 11 mm trocars were placed in the patient's abdomen laterally. Findings were as previously stated. The left uterine cornu was grasped. The eft tubal area contained a previously placed Falope ring. The left tube and the uteroovarian ligament and round ligaments were cauterized with Kleppinger and transected using the Harmonic Scalpel. The remainder of the broad ligament was cauterized and  transected with some difficulty given the previously mentioned adhesed ovary to the posterior uterus. The ovarian adhesions were released from the uterus using the Harmonic scalpel. This was repeated on the patient's right side. 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 The morcellator was placed in the patient's left port and the uterus was morcellated.   All surgical areas were made hemostatic. Arista hemostatic agent was placed for further hemostasis. The fascia of the left port was closed using the cone-shaped fascial closure device and the fascia of the umbilicus was closed using a 2-0 Vicryl. All skin incisions were closed with 4-0 Vicryl. The previously placed Hulka tenaculum had been removed prior to uterine amputation.   The patient had been given Indigo carmine dye.  Cystoscopy was performed and dye was seen to spill from both ureteral orifices, indicating ureteral integrity.  The patient tolerated the procedure well. Sponge, needle, and instrument counts were correct x2. The patient was awakened from anesthesia and taken to the recovery room in stable condition.  ____________________________ Sonda PrimesLashawn A. Patton SallesWeaver-Lee, MD law:slb D: 12/11/2011 12:21:21 ET T: 12/11/2011 12:48:07 ET JOB#: [id number]  cc: Flint MelterLashawn A. Patton SallesWeaver-Lee, MD, <Dictator> Sonda PrimesLASHAWN A WEAVER LEE MD ELECTRONICALLY SIGNED 12/12/2011 8:28
